# Patient Record
Sex: Male | Born: 1985 | Race: White | Hispanic: No | Marital: Single | Smoking: Never smoker
Health system: Southern US, Community
[De-identification: ages and names within clinical notes are randomized; demographics above are authoritative.]

## PROBLEM LIST (undated history)

## (undated) DIAGNOSIS — R55 Syncope and collapse: Secondary | ICD-10-CM

## (undated) HISTORY — DX: Syncope and collapse: R55

## (undated) HISTORY — PX: OTHER SURGICAL HISTORY: SHX169

---
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
## 2011-01-25 ENCOUNTER — Encounter: Payer: Self-pay | Admitting: Internal Medicine

## 2011-04-21 ENCOUNTER — Encounter: Payer: Self-pay | Admitting: Internal Medicine

## 2011-04-24 ENCOUNTER — Ambulatory Visit (HOSPITAL_COMMUNITY): Payer: 59 | Attending: Internal Medicine | Admitting: Radiology

## 2011-04-24 ENCOUNTER — Other Ambulatory Visit (HOSPITAL_COMMUNITY): Payer: Self-pay | Admitting: Internal Medicine

## 2011-04-24 ENCOUNTER — Ambulatory Visit (INDEPENDENT_AMBULATORY_CARE_PROVIDER_SITE_OTHER): Payer: 59 | Admitting: Internal Medicine

## 2011-04-24 ENCOUNTER — Encounter: Payer: Self-pay | Admitting: Internal Medicine

## 2011-04-24 ENCOUNTER — Telehealth: Payer: Self-pay | Admitting: Internal Medicine

## 2011-04-24 VITALS — BP 112/60 | HR 47 | Ht 71.0 in | Wt 151.8 lb

## 2011-04-24 DIAGNOSIS — R55 Syncope and collapse: Secondary | ICD-10-CM

## 2011-04-24 DIAGNOSIS — I059 Rheumatic mitral valve disease, unspecified: Secondary | ICD-10-CM | POA: Insufficient documentation

## 2011-04-24 DIAGNOSIS — I498 Other specified cardiac arrhythmias: Secondary | ICD-10-CM | POA: Insufficient documentation

## 2011-04-24 NOTE — Progress Notes (Signed)
Paul Oliver is a pleasant 25 y.o. male patient with a h/o recurrent unexplained syncope who presents today for EP consultation. He reports that his first episode of syncope occurred 3-4 years ago.  He states that he was hiking and stopped to smoke marijuana.  While smoking marijuana, he passed out.  He was seated when this occurred.  He reports having a brief prodrome of presyncope before then passing out for 2-3 seconds.  Upon waking, he reports that he felt "normal".  He did not seek medical attention. He did well until 6 months later when he again passed out.  He states that he was at a friends house.  He became emotionally stressed and passed out for several seconds.  He states that 2 years ago after eating a meal that he passed out.  He recalls having abdominal pain at that time.  He presented to Dr Paul Oliver who placed an event monitor.  This documented sick sinus syndrome with occasional pauses.  No further work up was advised at that time. He did well until this summer.  He reports having symptoms of abdominal pain for two days.  While his girlfriend (a Engineer, civil (consulting)) was pushing on his abdomen (doing an exam), he has loss of consciousness for 20-30 seconds.  He was in [place] at the time.  He went to a local hospital where he was observed.  EKG and blood work were normal per patient.  He has done well since that time, without any further syncope or presyncope.  Today, he denies symptoms of palpitations, chest pain, shortness of breath, orthopnea, PND, lower extremity edema, dizziness,  or neurologic sequela. The patient is tolerating medications without difficulties and is otherwise without complaint today.   Past Medical History  Diagnosis Date  . Syncope    Past Surgical History  Procedure Date  . None    Medications: none  No Known Allergies  History   Social History  . Marital Status: Single    Spouse Name: N/A    Number of Children: N/A  . Years of Education: N/A    Occupational History  . Not on file.   Social History Main Topics  . Smoking status: Never Smoker   . Smokeless tobacco: Not on file  . Alcohol Use: Yes     rare  . Drug Use: Yes     occasional marijuana  . Sexually Active: Not on file   Other Topics Concern  . Not on file   Social History Narrative   Moving back to [place] from [place].  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  [employer] graduate.    FH- he denies FH of arrrhythmias, sudden death, seizures or syncope  ROS- All systems are reviewed and negative except as per the HPI above  Physical Exam: Filed Vitals:   04/24/11 1348  BP: 112/60  Pulse: 47  Height: 5\' 11"  (1.803 m)  Weight: 151 lb 12.8 oz (68.856 kg)    GEN- The patient is well appearing, alert and oriented x 3 today.   Head- normocephalic, atraumatic Eyes-  Sclera clear, conjunctiva pink Ears- hearing intact Oropharynx- clear Neck- supple, no JVP Lymph- no cervical lymphadenopathy Lungs- Clear to ausculation bilaterally, normal work of breathing Heart- Regular rate and rhythm, no murmurs, rubs or gallops, PMI not laterally displaced GI- soft, NT, ND, + BS Extremities- no clubbing, cyanosis, or edema MS- no significant deformity or atrophy Skin- no rash or lesion Psych-  euthymic mood, full affect Neuro- strength and sensation are intact  EKG today reveals sinus bradycardia 47 bpm, j point elevation, otherwise normal ekg Echo today (upon my preliminary review) reveals normal RV/LV function I have reviewed lifewatch monitor from 8/10 which reveals sinus rhythm,  Episodes of bradycardia were predominantly during sleeping hours  Assessment and Plan:

## 2011-04-24 NOTE — Assessment & Plan Note (Signed)
Mr Loftus presents today for EP consultation regarding recurrence unexplained syncope.  He feels that all of his episodes of syncope have previously been in the setting of emotional stress.  Interestingly, today while watching his heart during his echocardiogram, he reports feeling emotionally stressed.  He then became abruptly bradycardic and presyncopal.  This resolved upon sitting up and looking away from the monitor. Upon my review of his echo today, he has normal RV and LV function.  As episodes of syncope are infrequent, I do not feel that wearing another event monitor would be helpful at this time.   His EKG reveals only bradycardia and J point St segment change.  He denies FH of arrhythmia. He appears to have high vagal tone and bradycardia with episodic vasovagal syncope.  I had a long discussion today with the patient regarding his syncope.  We discussed lifestyle modification to avoid episodes of vasovagal syncope.  In addition, I have informed him of DMVs recommendation that he not drive following syncope for 6 months.  At this time, I think that a reasonable approach would be to implant a Reveal implantable loop monitor.  This would help exclude VT/VF as a cause of his arrhythmias.  If this monitor revealed prolonged pauses with primarily a cardio inhibitor response with his vasovagal episodes then we could consider pacemaker implantation.  If no prolonged pauses then pacing would likely not be effective as he likely has vasomotor (vasodilatation) response causing his syncope also.  Given his young age, I think that we should try to avoid pacemaker implantation if possible. He is clear also in his decision to decline loop monitor implantation. I will therefore see him on an as needed basis.  He will contact my office if further syncope occurs.

## 2011-04-24 NOTE — Telephone Encounter (Signed)
Monitor Strips received from [facility] , took to [facility]   9/10/ 12/km

## 2011-04-24 NOTE — Patient Instructions (Signed)
Will follow up as needed--patient to call if he wishes to proceed with loop recorder

## 2011-04-25 ENCOUNTER — Encounter (HOSPITAL_COMMUNITY): Payer: Self-pay | Admitting: Family Medicine

## 2011-06-19 ENCOUNTER — Encounter: Payer: Self-pay | Admitting: *Deleted

## 2011-06-19 ENCOUNTER — Telehealth: Payer: Self-pay | Admitting: Internal Medicine

## 2011-06-19 ENCOUNTER — Ambulatory Visit (INDEPENDENT_AMBULATORY_CARE_PROVIDER_SITE_OTHER): Payer: 59 | Admitting: Internal Medicine

## 2011-06-19 ENCOUNTER — Encounter: Payer: Self-pay | Admitting: Internal Medicine

## 2011-06-19 ENCOUNTER — Other Ambulatory Visit: Payer: Self-pay | Admitting: Internal Medicine

## 2011-06-19 VITALS — BP 118/69 | HR 48 | Ht 71.0 in | Wt 160.0 lb

## 2011-06-19 DIAGNOSIS — R55 Syncope and collapse: Secondary | ICD-10-CM

## 2011-06-19 NOTE — Assessment & Plan Note (Signed)
By history, I think that this is most likely vasovagal syncope in etiology.  He has chronic bradycardia for which he is asymptomatic.  He appears to have a heightened vagal tone.  He has had no further syncope since his last visit to our office.  I had a long discussion today with the patient and his parents regarding his syncope.  We again discussed lifestyle modification to avoid episodes of vasovagal syncope.  In addition, I have reminded him of DMVs recommendation that he not drive following syncope for 6 months.  As he has had several episodes of syncope without clear cause, we have again discussed implantation of a  Reveal implantable loop monitor.  This would help exclude VT/VF as a cause of his arrhythmias.  If this monitor revealed prolonged pauses with primarily a cardio inhibitor response with his vasovagal episodes then we could consider pacemaker implantation.  If no prolonged pauses then pacing would likely not be effective as he likely has vasomotor (vasodilatation) response causing his syncope also.  Risks, benefits, and alternatives to ILR implantation were discussed at length today.  The patient understands that the risks primarily include bleeding, infection, and pain.  He accepts these risks and wishes to proceed.  We will therefore plan ILR implantation at the next available time.  He will contact my office should any problems arise in the interim.

## 2011-06-19 NOTE — Patient Instructions (Signed)
Your physician has recommended that you have a loop recorder

## 2011-06-19 NOTE — Progress Notes (Signed)
Paul Oliver is a pleasant 25 y.o. male patient with a h/o recurrent unexplained syncope who presents today for EP follow-up.  He is accompanied by his parents today.  Upon his initial consultation with me, he reported that his first episode of syncope occurred 3-4 years ago.  He stated that he was hiking and stopped to smoke marijuana.  While smoking marijuana, he passed out.  He was seated when this occurred.  He reports having a brief prodrome of presyncope before then passing out for 2-3 seconds.  Upon waking, he reports that he felt "normal".  He did not seek medical attention. He did well until 6 months later when he again passed out.  He states that he was at a friends house.  He became emotionally stressed and passed out for several seconds.  He states that 2 years ago after eating a meal that he passed out.  He recalls having abdominal pain at that time.  He presented to Dr Lyman Bishop if [facility] who placed an event monitor.  This documented sick sinus syndrome with occasional pauses.  No further work up was advised at that time. He did well until this summer.  He reports having symptoms of abdominal pain for two days.  While his girlfriend (a Engineer, civil (consulting)) was pushing on his abdomen (doing an exam), he has loss of consciousness for 20-30 seconds.  He was in [place] at the time.  He went to a local hospital where he was observed.  EKG and blood work were normal per patient.   He has done well since that time, without any further syncope or presyncope.  Today, he denies symptoms of palpitations, chest pain, shortness of breath, orthopnea, PND, lower extremity edema, dizziness,  or neurologic sequela. The patient is tolerating medications without difficulties and is otherwise without complaint today.   Past Medical History  Diagnosis Date  . Syncope     vasovagal by history   Past Surgical History  Procedure Date  . None    Medications: none  No Known Allergies  History   Social History  .  Marital Status: Single    Spouse Name: N/A    Number of Children: N/A  . Years of Education: N/A   Occupational History  . Not on file.   Social History Main Topics  . Smoking status: Never Smoker   . Smokeless tobacco: Not on file  . Alcohol Use: Yes     rare  . Drug Use: Yes     occasional marijuana  . Sexually Active: Not on file   Other Topics Concern  . Not on file   Social History Narrative   Moving back to [place] from [place].  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  [employer] graduate.    FH- he denies FH of arrrhythmias, sudden death, seizures or syncope  ROS- All systems are reviewed and negative except as per the HPI above  Physical Exam: Filed Vitals:   06/19/11 1135  BP: 118/69  Pulse: 48  Height: 5\' 11"  (1.803 m)  Weight: 160 lb (72.576 kg)    GEN- The patient is well appearing, alert and oriented x 3 today.   Head- normocephalic, atraumatic Eyes-  Sclera clear, conjunctiva pink Ears- hearing intact Oropharynx- clear Neck- supple, no JVP Lymph- no cervical lymphadenopathy Lungs- Clear to ausculation bilaterally, normal work of breathing Heart- Regular rate and rhythm, no murmurs, rubs or gallops, PMI not laterally displaced GI- soft, NT, ND, +  BS Extremities- no clubbing, cyanosis, or edema MS- no significant deformity or atrophy Skin- no rash or lesion Psych- euthymic mood, full affect Neuro- strength and sensation are intact  Echo reveals normal RV/LV function I have reviewed lifewatch monitor from 8/10 which reveals sinus rhythm,  Episodes of bradycardia were predominantly during sleeping hours  Assessment and Plan:

## 2011-07-14 ENCOUNTER — Encounter (HOSPITAL_COMMUNITY): Payer: Self-pay | Admitting: Pharmacy Technician

## 2011-07-18 ENCOUNTER — Other Ambulatory Visit: Payer: 59 | Admitting: *Deleted

## 2011-07-18 ENCOUNTER — Encounter: Payer: Self-pay | Admitting: Internal Medicine

## 2011-07-24 ENCOUNTER — Other Ambulatory Visit: Payer: Self-pay | Admitting: *Deleted

## 2011-07-24 DIAGNOSIS — R55 Syncope and collapse: Secondary | ICD-10-CM

## 2011-07-24 DIAGNOSIS — R404 Transient alteration of awareness: Secondary | ICD-10-CM

## 2011-07-25 ENCOUNTER — Encounter (HOSPITAL_COMMUNITY)
Admission: RE | Disposition: A | Discharge status: Home / Self Care | Payer: Self-pay | Source: Ambulatory Visit | Attending: Internal Medicine

## 2011-07-25 ENCOUNTER — Ambulatory Visit (HOSPITAL_COMMUNITY)
Admission: RE | Admit: 2011-07-25 | Discharge: 2011-07-25 | Disposition: A | Discharge status: Home / Self Care | Payer: 59 | Source: Ambulatory Visit | Attending: Internal Medicine | Admitting: Internal Medicine

## 2011-07-25 DIAGNOSIS — R55 Syncope and collapse: Secondary | ICD-10-CM | POA: Insufficient documentation

## 2011-07-25 HISTORY — PX: LOOP RECORDER IMPLANT: SHX5477

## 2011-07-25 SURGERY — LOOP RECORDER IMPLANT
Anesthesia: LOCAL

## 2011-07-25 MED ORDER — SODIUM CHLORIDE 0.9 % IR SOLN
80.0000 mg | Status: DC
  Filled 2011-07-25 (×3): qty 2

## 2011-07-25 MED ORDER — ONDANSETRON HCL 4 MG/2ML IJ SOLN: 4.0000 mg | Freq: Four times a day (QID) | INTRAMUSCULAR | Status: DC | PRN

## 2011-07-25 MED ORDER — SODIUM CHLORIDE 0.9 % IJ SOLN: 3.0000 mL | Freq: Two times a day (BID) | INTRAMUSCULAR | Status: DC

## 2011-07-25 MED ORDER — CHLORHEXIDINE GLUCONATE 4 % EX LIQD: 60.0000 mL | Freq: Once | CUTANEOUS | Status: DC

## 2011-07-25 MED ORDER — MUPIROCIN 2 % EX OINT
TOPICAL_OINTMENT | CUTANEOUS | Status: AC
  Administered 2011-07-25: 1
  Filled 2011-07-25: qty 22

## 2011-07-25 MED ORDER — SODIUM CHLORIDE 0.9 % IJ SOLN: 3.0000 mL | INTRAMUSCULAR | Status: DC | PRN

## 2011-07-25 MED ORDER — SODIUM CHLORIDE 0.9 % IV SOLN
INTRAVENOUS | Status: DC
  Administered 2011-07-25: 12:00:00 via INTRAVENOUS

## 2011-07-25 MED ORDER — ACETAMINOPHEN 500 MG PO TABS: 1000.0000 mg | ORAL_TABLET | Freq: Four times a day (QID) | ORAL | Status: DC

## 2011-07-25 MED ORDER — MIDAZOLAM HCL 5 MG/5ML IJ SOLN
INTRAMUSCULAR | Status: AC
  Filled 2011-07-25: qty 5

## 2011-07-25 MED ORDER — FENTANYL CITRATE 0.05 MG/ML IJ SOLN
INTRAMUSCULAR | Status: AC
  Filled 2011-07-25: qty 2

## 2011-07-25 MED ORDER — SODIUM CHLORIDE 0.9 % IV SOLN: 250.0000 mL | INTRAVENOUS | Status: DC | PRN

## 2011-07-25 MED ORDER — LIDOCAINE HCL (PF) 1 % IJ SOLN
INTRAMUSCULAR | Status: AC
  Filled 2011-07-25: qty 60

## 2011-07-25 MED ORDER — CEFAZOLIN SODIUM 1-5 GM-% IV SOLN: 1.0000 g | INTRAVENOUS | Status: DC

## 2011-07-25 MED ORDER — CEFAZOLIN SODIUM 1-5 GM-% IV SOLN
INTRAVENOUS | Status: AC
  Filled 2011-07-25: qty 50

## 2011-07-25 MED ORDER — ACETAMINOPHEN 500 MG PO TABS
ORAL_TABLET | ORAL | Status: AC
  Administered 2011-07-25: 1000 mg
  Filled 2011-07-25: qty 2

## 2011-07-25 MED ORDER — ACETAMINOPHEN 325 MG PO TABS: 325.0000 mg | ORAL_TABLET | ORAL | Status: DC | PRN

## 2011-07-25 NOTE — H&P (Signed)
Paul Oliver is a pleasant 25 y.o. male patient with a h/o recurrent unexplained syncope who presents today for ILR placement. He has done well since his last visit to my office, without further syncope.  Today, he denies symptoms of palpitations, chest pain, shortness of breath, orthopnea, PND, lower extremity edema, dizziness, or neurologic sequela. The patient is tolerating medications without difficulties and is otherwise without complaint today.  Past Medical History   Diagnosis  Date   .  Syncope      vasovagal by history    Past Surgical History   Procedure  Date   .  None     Medications: none  No Known Allergies  History    Social History   .  Marital Status:  Single     Spouse Name:  N/A     Number of Children:  N/A   .  Years of Education:  N/A    Occupational History   .  Not on file.    Social History Main Topics   .  Smoking status:  Never Smoker   .  Smokeless tobacco:  Not on file   .  Alcohol Use:  Yes      rare   .  Drug Use:  Yes      occasional marijuana   .  Sexually Active:  Not on file    Other Topics  Concern   .  Not on file    Social History Narrative    Moving back to [place] from [place]. 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    FH- he denies FH of arrrhythmias, sudden death, seizures or syncope  ROS- All systems are reviewed and negative except as per the HPI above  Physical Exam:  Filed Vitals:    06/19/11 1135   BP:  118/69   Pulse:  48   Height:  5\' 11"  (1.803 m)   Weight:  160 lb (72.576 kg)    GEN- The patient is well appearing, alert and oriented x 3 today.  Head- normocephalic, atraumatic  Eyes- Sclera clear, conjunctiva pink  Ears- hearing intact  Oropharynx- clear  Neck- supple, no JVP  Lymph- no cervical lymphadenopathy  Lungs- Clear to ausculation bilaterally, normal work of breathing  Heart- Regular rate and rhythm, no murmurs, rubs or gallops, PMI not  laterally displaced  GI- soft, NT, ND, + BS  Extremities- no clubbing, cyanosis, or edema  MS- no significant deformity or atrophy  Skin- no rash or lesion  Psych- euthymic mood, full affect  Neuro- strength and sensation are intact  Echo reveals normal RV/LV function  I have reviewed lifewatch monitor from 8/10 which reveals sinus rhythm, Episodes of bradycardia were predominantly during sleeping hours   Assessment and Plan:   Syncope - Hillis Range, MD 06/19/2011 1:38 PM Signed  By history, I think that this is most likely vasovagal syncope in etiology. He has chronic bradycardia for which he is asymptomatic. He appears to have a heightened vagal tone. He has had no further syncope since his last visit to our office.    As he has had several episodes of syncope without clear cause, we have again discussed implantation of a Reveal implantable loop monitor. This would help exclude VT/VF as a cause of his arrhythmias. If this monitor revealed prolonged pauses with primarily a cardio inhibitor response with his vasovagal episodes then we could consider pacemaker implantation. If no prolonged pauses then  pacing would likely not be effective as he likely has vasomotor (vasodilatation) response causing his syncope also.  Risks, benefits, and alternatives to ILR implantation were discussed at length today. The patient understands that the risks primarily include bleeding, infection, and pain. He accepts these risks and wishes to proceed.

## 2011-07-25 NOTE — Op Note (Signed)
SURGEON:  Hillis Range, MD     PREPROCEDURE DIAGNOSIS:  Recurrent Unexplained Syncope    POSTPROCEDURE DIAGNOSIS:  Recurrent Unexplained Syncope     PROCEDURES:   1. Implantable loop recorder implantation    INTRODUCTION:  Paul Oliver is a 25 y.o. male with a history of recurrent unexplained syncope who presents today for implantable loop implantation.  The patient has had several episodes of unexplained syncope.  No reversible causes have been identified.  He has worn an event monitor during which he did not have syncope.  The patient therefore presents today for implantable loop implantation.     DESCRIPTION OF PROCEDURE:  Informed written consent was obtained, and the patient was brought to the electrophysiology lab in a fasting state.  The patient was adequately sedated with intravenous versed and fentanyl as outlined in the nursing report for the procedure today.  Mapping over the patient's chest was performed by the EP lab staff to identify the area where electrograms were most prominent for ILR recording.  This area was found to be the left parasternal region over the 3rd-4th intercostal space. The patients left chest was therefore prepped and draped in the usual sterile fashion by the EP lab staff. The skin overlying the left parasternal region was infiltrated with lidocaine for local analgesia.  A 1.5-cm incision was made over the left parasternal region over the 3rd intercostal space.  A subcutaneous ILR pocket was fashioned using a combination of sharp and blunt dissection. Electrocautery was required to assure hemostasis.  The pocket was irrigated with copious gentamicin solution.  A Medtronic Reveal Parachute model [id number]  Implantable loop recorder was then placed into the pocket and secured to the pectoralis fascia with 2 separate #2 silk sutures.  R waves were very prominent and measured 0.78mV. The pocket was then closed in 2 layers with 2.0 Vicryl suture for the subcutaneous and  subcuticular layers.  Steri- Strips and a sterile dressing were then applied.  There were no early apparent complications.     CONCLUSIONS:   1. Successful implantation of a Medtronic Reveal XT implantable loop recorder for recurrent unexplained syncope  2. No early apparent complications.        Hillis Range, MD 07/25/2011 1:53 PM

## 2011-07-25 NOTE — Brief Op Note (Signed)
07/25/2011  1:52 PM  PATIENT:  Paul Oliver  25 y.o. male  PRE-OPERATIVE DIAGNOSIS:  syncope/Medtronic  POST-OPERATIVE DIAGNOSIS:  * No post-op diagnosis entered *  PROCEDURE:  Procedure(s): LOOP RECORDER IMPLANT  SURGEON:  Surgeon(s): Gardiner Rhyme, MD  PHYSICIAN ASSISTANT:   ASSISTANTS: none   ANESTHESIA:   local  EBL:   0cc  BLOOD ADMINISTERED:none  DRAINS: none   LOCAL MEDICATIONS USED:  LIDOCAINE 5CC  SPECIMEN:  No Specimen  DISPOSITION OF SPECIMEN:  N/A  COUNTS:  YES  TOURNIQUET:  * No tourniquets in log *  DICTATION: .Note written in EPIC  PLAN OF CARE: Discharge to home after PACU  PATIENT DISPOSITION:  Short Stay   Delay start of Pharmacological VTE agent (>24hrs) due to surgical blood loss or risk of bleeding:  {YES/NO/NOT APPLICABLE:20182

## 2011-08-03 ENCOUNTER — Ambulatory Visit (INDEPENDENT_AMBULATORY_CARE_PROVIDER_SITE_OTHER): Payer: 59 | Admitting: *Deleted

## 2011-08-03 ENCOUNTER — Encounter: Payer: Self-pay | Admitting: Internal Medicine

## 2011-08-03 DIAGNOSIS — R55 Syncope and collapse: Secondary | ICD-10-CM

## 2011-08-03 NOTE — Progress Notes (Signed)
Wound check-ILR 

## 2011-08-18 ENCOUNTER — Encounter: Payer: Self-pay | Admitting: Internal Medicine

## 2011-11-09 ENCOUNTER — Encounter: Payer: 59 | Admitting: Internal Medicine

## 2012-01-18 ENCOUNTER — Telehealth: Payer: Self-pay | Admitting: Internal Medicine

## 2012-01-18 NOTE — Telephone Encounter (Signed)
Pt calling re heart monitor , and pt passed out , wants to know what to do?

## 2012-01-18 NOTE — Telephone Encounter (Signed)
Will forward to device clinic  

## 2012-01-19 NOTE — Telephone Encounter (Signed)
Spoke w/Marcia Madilyn Fireman (MDT rep) and trying to correlate place/time for pt's loop recorder to be checked. Pt aware and waiting for call back from pt at this time to set up a place for 01-20-12 per Leta Jungling.

## 2012-01-20 ENCOUNTER — Encounter: Payer: Self-pay | Admitting: Internal Medicine

## 2012-01-22 NOTE — Telephone Encounter (Signed)
Per Jana Half (MDT)---pt loop recorder checked on 01-20-12. Strips reviewed with Dr Johney Frame per Leta Jungling.

## 2012-01-24 ENCOUNTER — Telehealth: Payer: Self-pay | Admitting: Internal Medicine

## 2012-02-08 ENCOUNTER — Ambulatory Visit (INDEPENDENT_AMBULATORY_CARE_PROVIDER_SITE_OTHER): Payer: 59 | Admitting: Internal Medicine

## 2012-02-08 ENCOUNTER — Encounter: Payer: Self-pay | Admitting: Internal Medicine

## 2012-02-08 VITALS — BP 114/70 | HR 57 | Ht 71.0 in | Wt 151.4 lb

## 2012-02-08 DIAGNOSIS — R55 Syncope and collapse: Secondary | ICD-10-CM

## 2012-02-08 NOTE — Patient Instructions (Signed)
Your physician wants you to follow-up in: 12 months with Dr Jacquiline Doe will receive a reminder letter in the mail two months in advance. If you don't receive a letter, please call our office to schedule the follow-up appointment.   Remote monitoring is used to monitor your Pacemaker of ICD from home. This monitoring reduces the number of office visits required to check your device to one time per year. It allows [place] to keep an eye on the functioning of your device to ensure it is working properly. You are scheduled for a device check from home on 05/13/2012. You may send your transmission at any time that day. If you have a wireless device, the transmission will be sent automatically. After your physician reviews your transmission, you will receive a postcard with your next transmission date.

## 2012-02-08 NOTE — Progress Notes (Signed)
Paul Oliver is a 26 y.o. male who presents today for routine electrophysiology followup.  Since last being seen in our clinic, the patient reports doing very well.  He remains very active.  He did an episode of vasovagal syncope 01/15/12.  He reports that it was dark in his bedroom and he was walking towards the bed.  He did not see his bedside and struck his knee very hard against the bed.  He reports extreme pain followed by brief nausea and then syncope for several seconds.  Upon waking, his symptoms quickly resolved.  He has had no further episodes since that time.  Today, he denies symptoms of palpitations, chest pain, shortness of breath,  lower extremity edema, dizziness, presyncope, or syncope.  The patient is otherwise without complaint today.   Past Medical History  Diagnosis Date  . Syncope     vasovagal by history   Past Surgical History  Procedure Date  . None     No current outpatient prescriptions on file.    Physical Exam: Filed Vitals:   02/08/12 1601  BP: 114/70  Pulse: 57  Height: 5\' 11"  (1.803 m)  Weight: 151 lb 6.4 oz (68.675 kg)    GEN- The patient is well appearing, alert and oriented x 3 today.    Exam not performed today.  More than 20 minutes spent in counseling the patient and his mother today.  ILR interrogation reviewed in detail  Assessment and Plan:

## 2012-02-08 NOTE — Assessment & Plan Note (Signed)
The patient's syncope is clearly vasovagal in nature.  His recent episode was due to extreme pain in the setting of striking his knee against his bed.  With this episode, interrogation of his ILR reveals that he had a 10 second pause.  He has had no other episodes of syncope/ presyncope.  He has found that with lifestyle modification, he has been able to identify and avoid triggers for his syncope.  He finds that by assuming a recumbent position that symptoms will typically resolve without LOC.  Today, I had a long discussion with the patient and his mother.   Recent literature suggests that in patients with extreme vasovagal syncope and prolonged pauses that permanent pacemaker implantation can be beneficial.  I have therefore offered PPM implant today.  The patient is very clear that he does not want a pacemaker at this time.  Given his young age, I do worry about the implications of a transvenous pacing system of the expected longevity of this young man's life.  I think that he would be an excellent candidate for a leadless pacemaker once these are available.  I have spoken with both SJM and MDT who feels that we are more than a year away from being able to offer him this technology.  The patient is clear in his decision to decline PPM presently, but may be willing to reconsider down the road.  He will presently continue with lifestyle modification.  We will arrange for him to have a carelink box for routine ILR transmissions. I will schedule to see him again in 12 months, though he should contact me sooner should further syncope arise.

## 2012-05-13 ENCOUNTER — Encounter: Payer: 59 | Admitting: *Deleted

## 2012-05-17 ENCOUNTER — Encounter: Payer: Self-pay | Admitting: *Deleted

## 2012-09-25 ENCOUNTER — Encounter: Payer: 59 | Admitting: Internal Medicine

## 2012-10-10 ENCOUNTER — Ambulatory Visit (INDEPENDENT_AMBULATORY_CARE_PROVIDER_SITE_OTHER): Payer: 59 | Admitting: Internal Medicine

## 2012-10-10 ENCOUNTER — Encounter: Payer: Self-pay | Admitting: Internal Medicine

## 2012-10-10 VITALS — BP 133/72 | HR 53 | Ht 71.0 in | Wt 154.0 lb

## 2012-10-10 DIAGNOSIS — R55 Syncope and collapse: Secondary | ICD-10-CM

## 2012-10-10 NOTE — Progress Notes (Signed)
FARD BORUNDA is a 27 y.o. male who presents today for routine electrophysiology followup.  Since last being seen in our clinic, the patient reports doing very well.  He remains very active.  He has had several recent vagal episodes with presyncope/ syncope.   Today, he denies symptoms of palpitations, chest pain, shortness of breath,  lower extremity edema, dizziness, presyncope, or syncope.  The patient is otherwise without complaint today.   Past Medical History  Diagnosis Date  . Syncope     vasovagal by history   Past Surgical History  Procedure Laterality Date  . None      No current outpatient prescriptions on file.   No current facility-administered medications for this visit.   Physical Exam: Filed Vitals:   10/10/12 1447  BP: 133/72  Pulse: 53  Height: 5\' 11"  (1.803 m)  Weight: 154 lb (69.854 kg)    GEN- The patient is well appearing, alert and oriented x 3 today.   Head- normocephalic, atraumatic Eyes-  Sclera clear, conjunctiva pink Ears- hearing intact Oropharynx- clear Neck- supple, no JVP Lymph- no cervical lymphadenopathy Lungs- Clear to ausculation bilaterally, normal work of breathing Heart- Regular rate and rhythm, no murmurs, rubs or gallops, PMI not laterally displaced GI- soft, NT, ND, + BS Extremities- no clubbing, cyanosis, or edema MS- no significant deformity or atrophy Skin- no rash or lesion Psych- euthymic mood, full affect Neuro- strength and sensation are intact  ILR interrogation reviewed in detail  ekg today reveals sinus rhythm 52 bpm, otherwise normal ekg  Assessment and Plan:

## 2012-10-10 NOTE — Assessment & Plan Note (Addendum)
Vasovagal syncope has occurred since last visit with prolonged RR intervals observed on ILR.  We discussed the leadless pacemaker today as an option due to his very prolonged pauses with syncope.  He would like to avoid this presently.  He would like to continue conservative measures and lifestyle modification to avoid syncope.  He would like to have his ILR removed at this time. Risks, benefits, and alternatives to ILR removal were discussed at length with the patient who wishes to proceed.  We will schedule the procedure for the next available time.

## 2012-11-06 ENCOUNTER — Telehealth: Payer: Self-pay | Admitting: Internal Medicine

## 2012-11-06 NOTE — Telephone Encounter (Signed)
New Prob   Pt requesting to schedule removal of loop monitor. Would like to speak to nurse.

## 2012-11-08 NOTE — Telephone Encounter (Signed)
Spoke with patient 4/15 or 4/29

## 2012-11-12 ENCOUNTER — Encounter: Payer: Self-pay | Admitting: *Deleted

## 2012-11-12 ENCOUNTER — Other Ambulatory Visit: Payer: Self-pay | Admitting: *Deleted

## 2012-11-12 DIAGNOSIS — R55 Syncope and collapse: Secondary | ICD-10-CM

## 2012-11-12 NOTE — Telephone Encounter (Signed)
Procedure is scheduled

## 2012-11-12 NOTE — Telephone Encounter (Signed)
Will sch for 12/10/12 Paul Oliver is scheduling patient

## 2012-11-27 ENCOUNTER — Encounter (HOSPITAL_COMMUNITY): Payer: Self-pay

## 2012-12-09 ENCOUNTER — Telehealth: Payer: Self-pay | Admitting: Internal Medicine

## 2012-12-09 MED ORDER — SODIUM CHLORIDE 0.9 % IR SOLN
80.0000 mg | Status: AC
  Filled 2012-12-09: qty 2

## 2012-12-09 MED ORDER — CEFAZOLIN SODIUM-DEXTROSE 2-3 GM-% IV SOLR
2.0000 g | INTRAVENOUS | Status: AC
  Filled 2012-12-09: qty 50

## 2012-12-09 NOTE — Telephone Encounter (Signed)
New Prob    Pt is having procedure done tomorrow and he wants to know what time he should be showing up for prep. Please call.

## 2012-12-09 NOTE — Telephone Encounter (Signed)
I have spoken with patient and he is going to be at the hospital by 11:30am tomorrow.  Sr Allred will do explant between cases

## 2012-12-10 ENCOUNTER — Ambulatory Visit (HOSPITAL_COMMUNITY)
Admission: RE | Admit: 2012-12-10 | Discharge: 2012-12-10 | Disposition: A | Discharge status: Home / Self Care | Payer: 59 | Source: Ambulatory Visit | Attending: Internal Medicine | Admitting: Internal Medicine

## 2012-12-10 ENCOUNTER — Encounter (HOSPITAL_COMMUNITY)
Admission: RE | Disposition: A | Discharge status: Home / Self Care | Payer: Self-pay | Source: Ambulatory Visit | Attending: Internal Medicine

## 2012-12-10 DIAGNOSIS — F1210 Cannabis abuse, uncomplicated: Secondary | ICD-10-CM | POA: Insufficient documentation

## 2012-12-10 DIAGNOSIS — R55 Syncope and collapse: Secondary | ICD-10-CM

## 2012-12-10 DIAGNOSIS — Z4509 Encounter for adjustment and management of other cardiac device: Secondary | ICD-10-CM | POA: Insufficient documentation

## 2012-12-10 HISTORY — PX: LOOP RECORDER EXPLANT: SHX5476

## 2012-12-10 LAB — CBC
HCT: 37.2 % — ABNORMAL LOW (ref 39.0–52.0)
Hemoglobin: 12.7 g/dL — ABNORMAL LOW (ref 13.0–17.0)
MCH: 28.9 pg (ref 26.0–34.0)
MCHC: 34.1 g/dL (ref 30.0–36.0)

## 2012-12-10 LAB — BASIC METABOLIC PANEL
BUN: 16 mg/dL (ref 6–23)
GFR calc non Af Amer: >90 mL/min (ref >90)
Glucose, Bld: 88 mg/dL (ref 70–99)
Potassium: 3.9 mEq/L (ref 3.5–5.1)

## 2012-12-10 SURGERY — LOOP RECORDER EXPLANT
Anesthesia: LOCAL

## 2012-12-10 MED ORDER — CHLORHEXIDINE GLUCONATE 4 % EX LIQD
60.0000 mL | Freq: Once | CUTANEOUS | Status: DC
  Filled 2012-12-10: qty 60

## 2012-12-10 MED ORDER — SODIUM CHLORIDE 0.9 % IV SOLN
INTRAVENOUS | Status: DC
  Administered 2012-12-10: 12:00:00 via INTRAVENOUS

## 2012-12-10 MED ORDER — MIDAZOLAM HCL 5 MG/5ML IJ SOLN
INTRAMUSCULAR | Status: AC
  Filled 2012-12-10: qty 5

## 2012-12-10 MED ORDER — MUPIROCIN 2 % EX OINT
TOPICAL_OINTMENT | Freq: Two times a day (BID) | CUTANEOUS | Status: DC
  Administered 2012-12-10: 12:00:00 via NASAL
  Filled 2012-12-10: qty 22

## 2012-12-10 MED ORDER — MUPIROCIN 2 % EX OINT
TOPICAL_OINTMENT | CUTANEOUS | Status: AC
  Filled 2012-12-10: qty 22

## 2012-12-10 MED ORDER — LIDOCAINE HCL (PF) 1 % IJ SOLN
INTRAMUSCULAR | Status: AC
  Filled 2012-12-10: qty 30

## 2012-12-10 NOTE — Op Note (Signed)
SURGEON:  Hillis Range, MD     PREPROCEDURE DIAGNOSIS:  Vasovagal Syncope    POSTPROCEDURE DIAGNOSIS:  Recurrent Vasovagal Syncope     PROCEDURES:   1. Implantable loop recorder explantation    INTRODUCTION:  Paul Oliver is a 27 y.o. male with a history of recurrent vasovagal syncope who presents today for implantable loop explantation.  The patient has had multiple episodes of syncope with his ILR in place.  This has successfully documented vasovagal syncope with a prominent chronoinhibitory component and pauses > 10 second observed.  He has had no symptomatic brady events except associated with vagal episodes.  He has had no ventricular arrhythmias documented.  He has done very well with lifestyle modification with no recent episodes of syncope.  He is clear in his decision to avoid PPM at this time.  The patient therefore presents today for implantable loop explantation.     DESCRIPTION OF PROCEDURE:  Informed written consent was obtained, and the patient was brought to the electrophysiology lab in a fasting state.  The patient was adequately sedated with intravenous versed as outlined in the nursing report for the procedure today.  His MDT Reveal XT ILR was interrogated and confirmed to have no arrhythmias since last check in our office.  The patients left chest was therefore prepped and draped in the usual sterile fashion by the EP lab staff. The skin overlying the left parasternal region was infiltrated with lidocaine for local analgesia.  A 1.5-cm incision was made over the ILR pocket.  The device was exposed using a combination of sharp and blunt dissection. Electrocautery was required to assure hemostasis. 2 separate silk sutures were identified and removed in their entirety.  The MDT Reveal XT monitor was then removed.  The pocket was irrigated with copious gentamicin solution.  The pocket was then closed in 2 layers with 2.0 Vicryl suture for the subcutaneous and subcuticular layers.  Steri-  Strips and a sterile dressing were then applied.  There were no early apparent complications.     CONCLUSIONS:   1. Successful explantation of a [employer] Reveal XT implantable loop recorder   2. No early apparent complications.        Hillis Range, MD 12/10/2012 3:27 PM

## 2012-12-10 NOTE — H&P (Signed)
    PCP:  MAZZOCCHI, Rise Mu, MD  The patient presents today for ILR removal.  Since last being seen in our clinic, the patient reports doing very well.  Today, he denies symptoms of palpitations, chest pain, shortness of breath, orthopnea, PND, lower extremity edema, dizziness, presyncope, syncope, or neurologic sequela.   Past Medical History  Diagnosis Date  . Syncope     vasovagal by history   Past Surgical History  Procedure Laterality Date  . None      Current Facility-Administered Medications  Medication Dose Route Frequency Provider Last Rate Last Dose  . 0.9 %  sodium chloride infusion   Intravenous Continuous Hillis Range, MD 50 mL/hr at 12/10/12 1208    . ceFAZolin (ANCEF) IVPB 2 g/50 mL premix  2 g Intravenous On Call Hillis Range, MD      . chlorhexidine (HIBICLENS) 4 % liquid 4 application  60 mL Topical Once Hillis Range, MD      . gentamicin (GARAMYCIN) 80 mg in sodium chloride irrigation 0.9 % 500 mL irrigation  80 mg Irrigation On Call Hillis Range, MD      . mupirocin ointment (BACTROBAN) 2 %           . mupirocin ointment (BACTROBAN) 2 %   Nasal BID Hillis Range, MD        No Known Allergies  History   Social History  . Marital Status: Single    Spouse Name: N/A    Number of Children: N/A  . Years of Education: N/A   Occupational History  . Not on file.   Social History Main Topics  . Smoking status: Never Smoker   . Smokeless tobacco: Not on file  . Alcohol Use: Yes     Comment: rare  . Drug Use: Yes     Comment: occasional marijuana  . Sexually Active: Not on file   Other Topics Concern  . Not on file   Social History Narrative   Moving back to [place] from [place].  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  [employer] graduate.    Physical Exam:  GEN- The patient is well appearing, alert and oriented x 3 today.   Head- normocephalic, atraumatic Eyes-  Sclera clear, conjunctiva pink Ears-  hearing intact Oropharynx- clear Neck- supple, no JVP Lymph- no cervical lymphadenopathy Lungs- Clear to ausculation bilaterally, normal work of breathing Heart- Regular rate and rhythm, no murmurs, rubs or gallops, PMI not laterally displaced GI- soft, NT, ND, + BS Extremities- no clubbing, cyanosis, or edema  Assessment and Plan:  1. Vasovagal syncope with profound chrontropic depressor response He is clear that he would like to have his ILR removed.  As we have clearly defined the nature of his syncope, I think that this is reasonable. He is presently doing very well with lifestyl modification alone.  He would like to avoid PPM long term, even though he has had multiple pauses > 10 seconds (all in the setting of a vagal trigger).  Risks, benefits, and alternatives to ILR removal were discussed at length with the patient today who understands and wishes to proceed.

## 2014-02-26 NOTE — Telephone Encounter (Signed)
Close Encounter 

## 2014-07-22 ENCOUNTER — Encounter (HOSPITAL_COMMUNITY): Payer: Self-pay | Admitting: Internal Medicine

## 2014-07-23 ENCOUNTER — Encounter (HOSPITAL_COMMUNITY): Payer: Self-pay | Admitting: Internal Medicine
